# Patient Record
Sex: Female | Born: 2000 | Race: White | Hispanic: No | Marital: Single | Smoking: Never smoker
Health system: Southern US, Community
[De-identification: ages and names within clinical notes are randomized; demographics above are authoritative.]

## PROBLEM LIST (undated history)

## (undated) DIAGNOSIS — S8990XA Unspecified injury of unspecified lower leg, initial encounter: Secondary | ICD-10-CM

## (undated) DIAGNOSIS — J45909 Unspecified asthma, uncomplicated: Secondary | ICD-10-CM

## (undated) DIAGNOSIS — S322XXA Fracture of coccyx, initial encounter for closed fracture: Secondary | ICD-10-CM

---
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
## 2021-01-02 ENCOUNTER — Ambulatory Visit (HOSPITAL_COMMUNITY)
Admission: RE | Admit: 2021-01-02 | Discharge: 2021-01-02 | Disposition: A | Discharge status: Home / Self Care | Payer: Self-pay | Source: Ambulatory Visit | Attending: Student | Admitting: Student

## 2021-01-02 ENCOUNTER — Other Ambulatory Visit: Payer: Self-pay

## 2021-01-02 DIAGNOSIS — S12191A Other nondisplaced fracture of second cervical vertebra, initial encounter for closed fracture: Secondary | ICD-10-CM | POA: Insufficient documentation

## 2021-01-30 ENCOUNTER — Other Ambulatory Visit (HOSPITAL_BASED_OUTPATIENT_CLINIC_OR_DEPARTMENT_OTHER): Payer: Self-pay

## 2021-03-01 ENCOUNTER — Other Ambulatory Visit: Payer: Self-pay | Admitting: Orthopedic Surgery

## 2021-03-01 DIAGNOSIS — S32811D Multiple fractures of pelvis with unstable disruption of pelvic ring, subsequent encounter for fracture with routine healing: Secondary | ICD-10-CM

## 2021-03-21 ENCOUNTER — Other Ambulatory Visit: Payer: Self-pay

## 2021-03-21 ENCOUNTER — Ambulatory Visit
Admission: RE | Admit: 2021-03-21 | Discharge: 2021-03-21 | Disposition: A | Discharge status: Home / Self Care | Payer: Self-pay | Source: Ambulatory Visit | Attending: Orthopedic Surgery | Admitting: Orthopedic Surgery

## 2021-03-21 DIAGNOSIS — S32811D Multiple fractures of pelvis with unstable disruption of pelvic ring, subsequent encounter for fracture with routine healing: Secondary | ICD-10-CM

## 2021-07-10 ENCOUNTER — Encounter: Payer: Self-pay | Admitting: Plastic Surgery

## 2021-07-10 ENCOUNTER — Ambulatory Visit (INDEPENDENT_AMBULATORY_CARE_PROVIDER_SITE_OTHER): Payer: Self-pay | Admitting: Plastic Surgery

## 2021-07-10 DIAGNOSIS — D1723 Benign lipomatous neoplasm of skin and subcutaneous tissue of right leg: Secondary | ICD-10-CM
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
## 2021-07-10 NOTE — Addendum Note (Signed)
Addended by: Threasa Beards K on: 07/10/2021 03:30 PM   Modules accepted: Orders

## 2021-07-12 ENCOUNTER — Institutional Professional Consult (permissible substitution): Payer: Self-pay | Admitting: Plastic Surgery

## 2021-07-16 ENCOUNTER — Telehealth: Payer: Self-pay | Admitting: Plastic Surgery

## 2021-07-16 NOTE — Telephone Encounter (Signed)
Called patient to find out if she has health insurance. Patient said she just signed up for health insurance and should receive card in a couple of weeks. Patient understands to upload copy of card to [facility] or drop it off by the office for it to be scanned into the system so we can begin authorization and we get her scheduled for surgery.

## 2021-07-17 ENCOUNTER — Other Ambulatory Visit (HOSPITAL_BASED_OUTPATIENT_CLINIC_OR_DEPARTMENT_OTHER): Payer: Self-pay

## 2021-08-01 ENCOUNTER — Telehealth: Payer: Self-pay

## 2021-08-01 NOTE — Telephone Encounter (Signed)
Called patient to inquire if she has received her new insurance card. Was not able to LM on VM due to VM was full. Sent message via [place].

## 2022-09-05 IMAGING — MR MR CERVICAL SPINE W/O CM
4 of 6 series · 19 of 48 positions shown · non-contrast
Comparison: Cervical spine CT from earlier the same day

CLINICAL DATA: Evaluate cervical spine fracture

EXAM:
MRI CERVICAL SPINE WITHOUT CONTRAST
TECHNIQUE: Multiplanar, multisequence MR imaging of the cervical spine was
performed. No intravenous contrast was administered.

[Series 3: T2 · sagittal · 3.0mm · 0.43mm/px · 4 of 16 slices shown (1 of 2)]
[im 1/16]
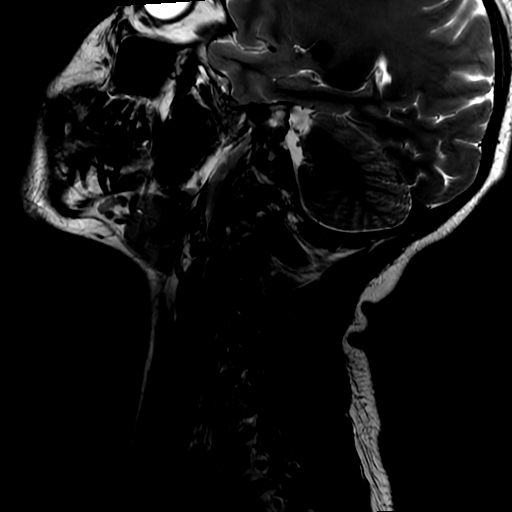
[im 6/16]
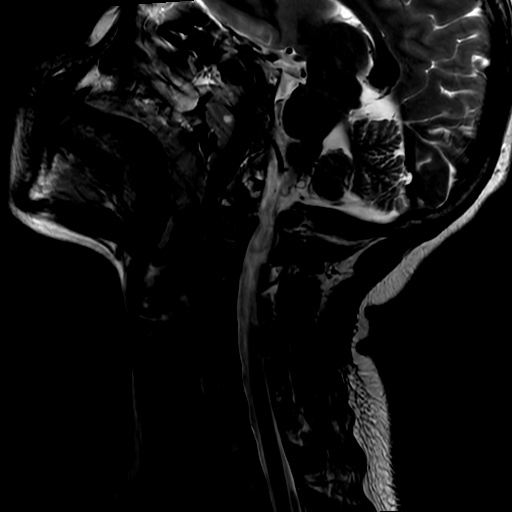
[im 11/16]
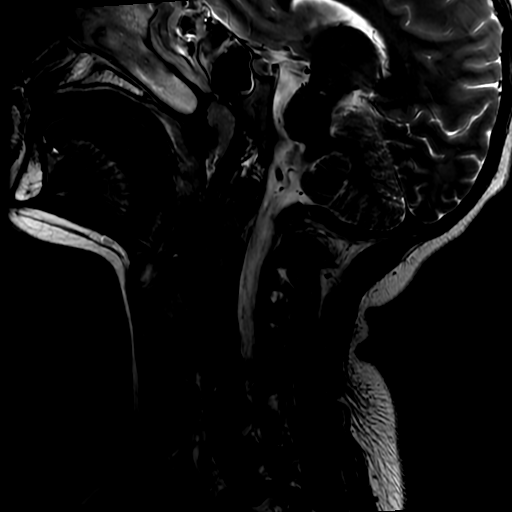
[im 16/16]
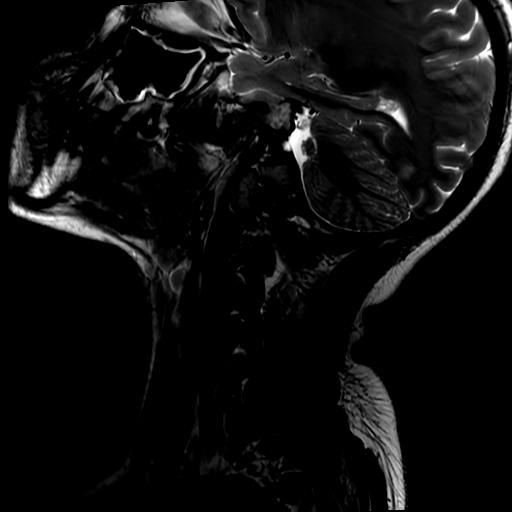

[Series 4: FLAIR · sagittal · 3.0mm · 0.43mm/px · 3 of 16 slices shown]
[im 1/16]
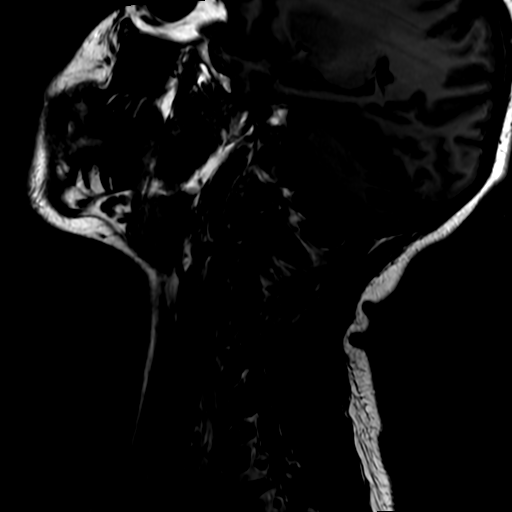
[im 11/16]
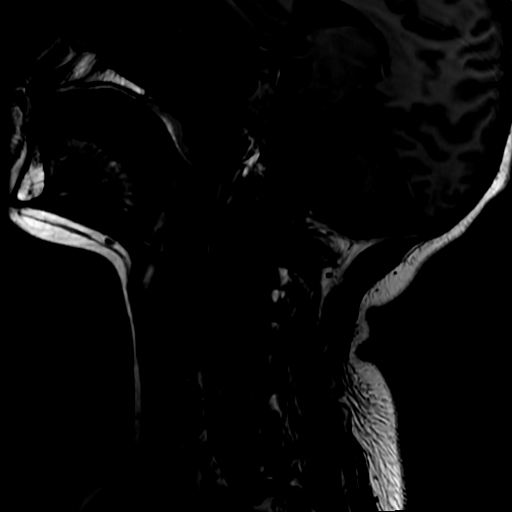
[im 16/16]
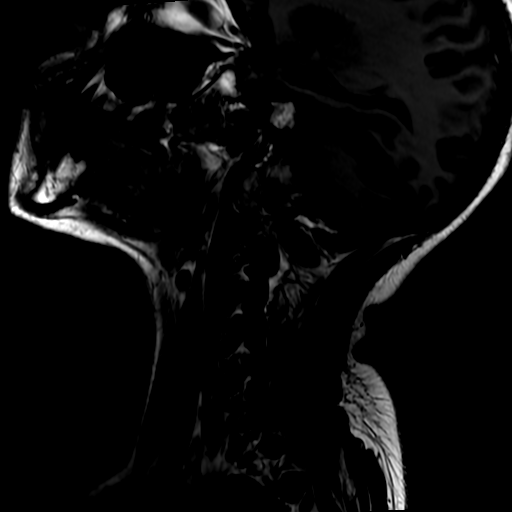

[Series 7: STIR · sagittal · 3.0mm · 0.43mm/px · 4 of 16 slices shown]
[im 1/16]
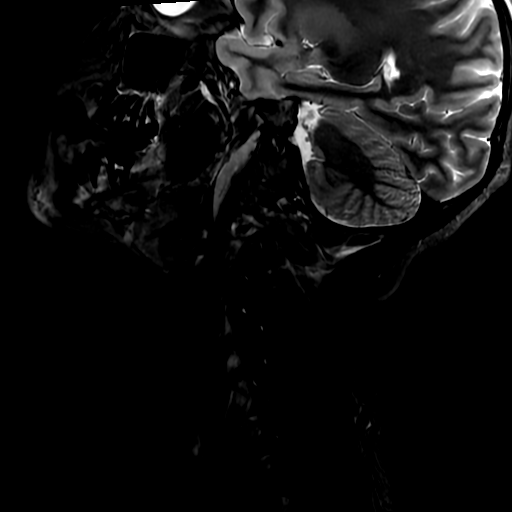
[im 6/16]
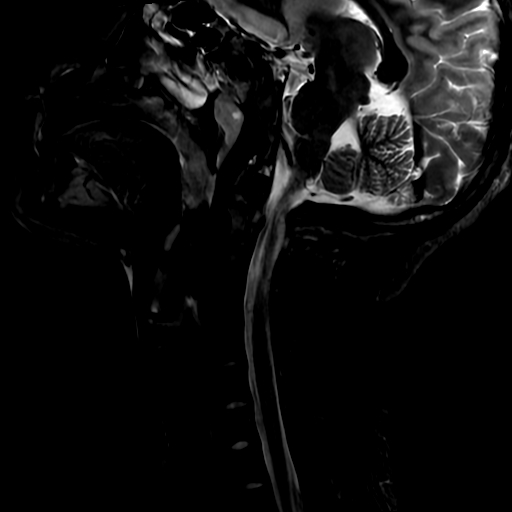
[im 11/16]
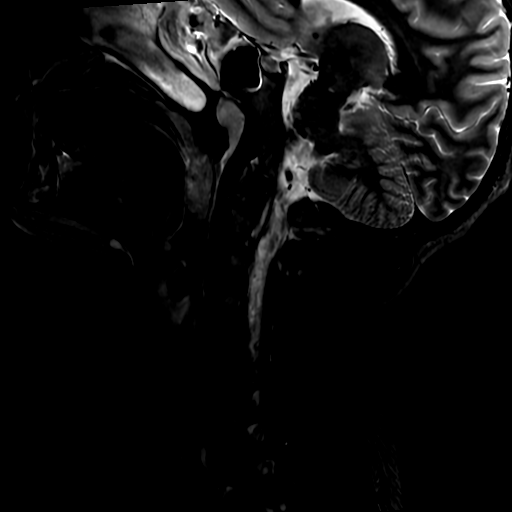
[im 16/16]
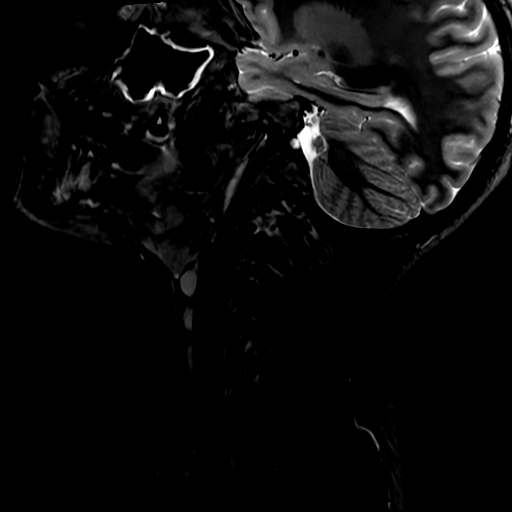

[Series 9: T2 · axial · 3.0mm · 0.35mm/px · z∈[-210,-103]mm · 8 of 34 slices shown (2 of 2)]
[im 1/34]
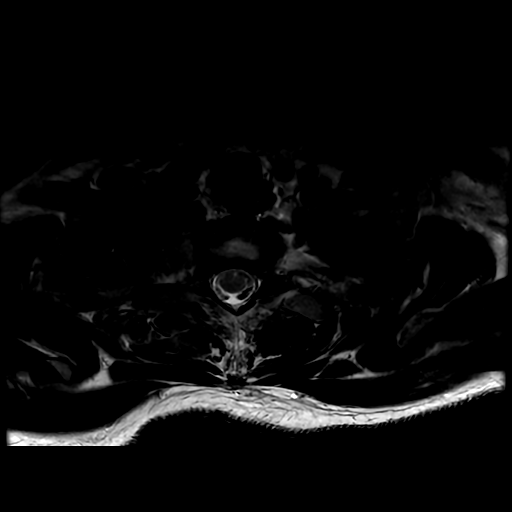
[im 5/34]
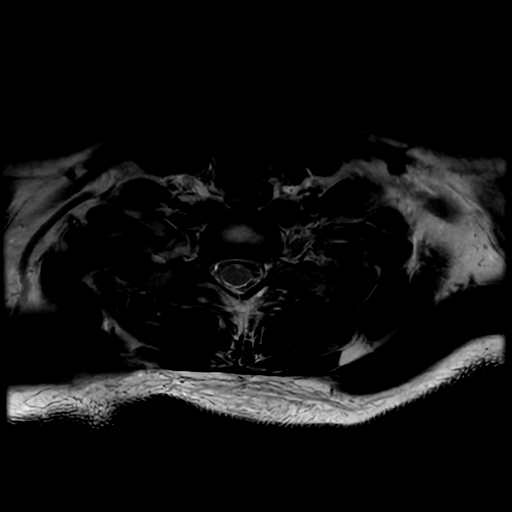
[im 10/34]
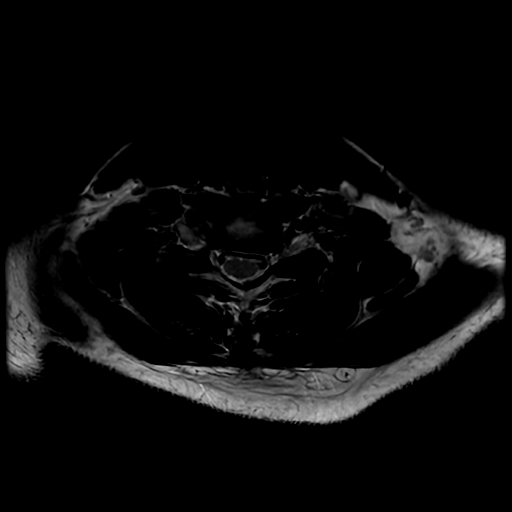
[im 15/34]
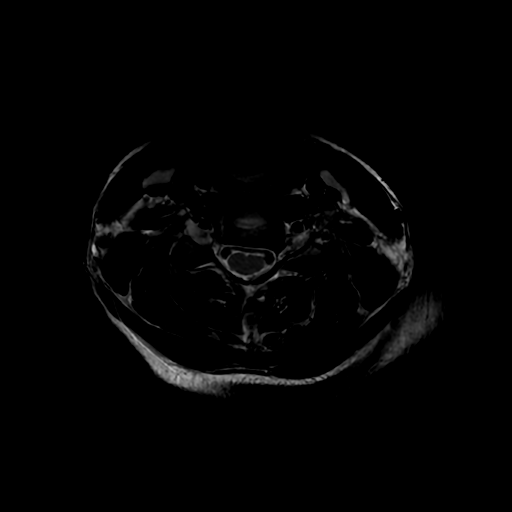
[im 19/34]
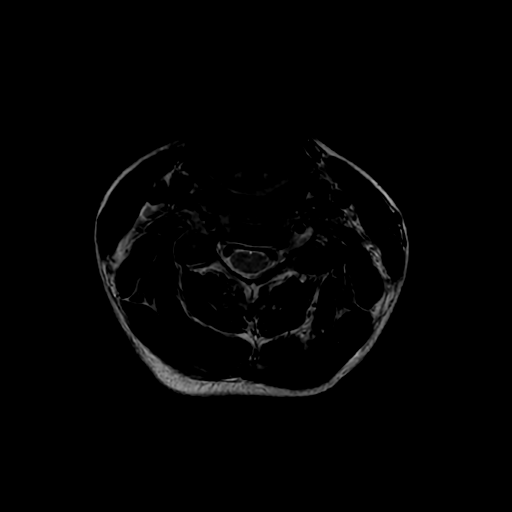
[im 24/34]
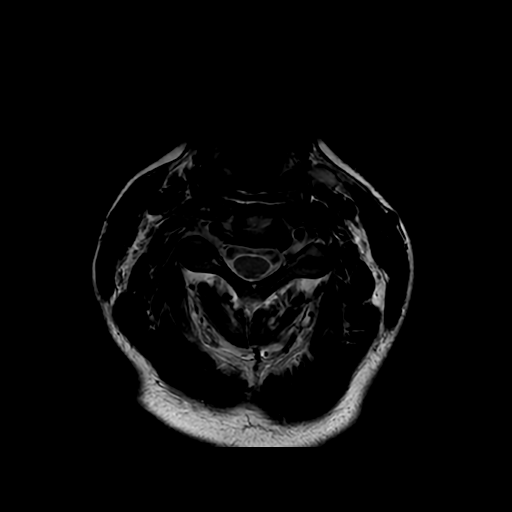
[im 29/34]
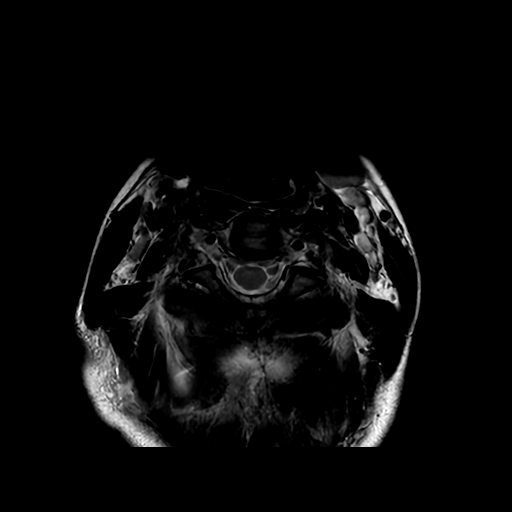
[im 34/34]
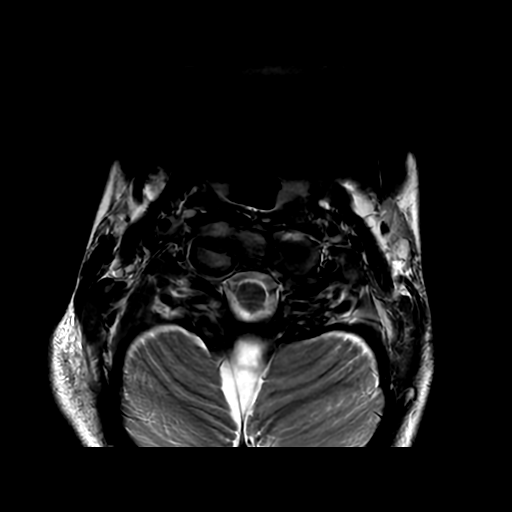

[19 of 48 positions shown; findings below may reference images not displayed]

FINDINGS: Alignment: Slight anterolisthesis at C2-3.

Vertebrae: C2 hangman's fracture and posteroinferior corner fracture
as previously described by CT. The posteroinferior corner of C2
remains attached to the PLL which appears contiguous superior to the
fragment. No disc hyperintensity or anterior longitudinal ligament
disruption is seen. There is inter spinous edema at the level of
C1-2 but the ligamentum flavum is visibly intact.

Cord: No visible contusion or significant hematoma.

Posterior Fossa, vertebral arteries, paraspinal tissues: As above

Disc levels:

No degenerative changes or impingement
IMPRESSION: Hangman's fracture involving the posteroinferior corner of C2 which
remains attached to the PLL, which is continuous. There is mild C2-3
anterolisthesis with interspinous edema/strain, but the ligamentum
flavum and disc space appear intact.
